# Patient Record
Sex: Male | Born: 1965 | Race: White | Hispanic: No | Marital: Single | Smoking: Current every day smoker
Health system: Southern US, Community
[De-identification: ages and names within clinical notes are randomized; demographics above are authoritative.]

## PROBLEM LIST (undated history)

## (undated) HISTORY — PX: HAND SURGERY: SHX662

## (undated) HISTORY — PX: MANDIBLE FRACTURE SURGERY: SHX706

---

## 2001-02-22 ENCOUNTER — Emergency Department (HOSPITAL_COMMUNITY): Admission: EM | Admit: 2001-02-22 | Discharge: 2001-02-22 | Payer: Self-pay | Admitting: Emergency Medicine

## 2001-04-16 ENCOUNTER — Emergency Department (HOSPITAL_COMMUNITY): Admission: EM | Admit: 2001-04-16 | Discharge: 2001-04-16 | Payer: Self-pay | Admitting: Emergency Medicine

## 2001-05-26 ENCOUNTER — Emergency Department (HOSPITAL_COMMUNITY): Admission: EM | Admit: 2001-05-26 | Discharge: 2001-05-26 | Payer: Self-pay | Admitting: Emergency Medicine

## 2001-05-28 ENCOUNTER — Emergency Department (HOSPITAL_COMMUNITY): Admission: EM | Admit: 2001-05-28 | Discharge: 2001-05-28 | Payer: Self-pay | Admitting: Emergency Medicine

## 2001-06-21 ENCOUNTER — Emergency Department (HOSPITAL_COMMUNITY): Admission: EM | Admit: 2001-06-21 | Discharge: 2001-06-21 | Payer: Self-pay | Admitting: *Deleted

## 2001-06-24 ENCOUNTER — Emergency Department (HOSPITAL_COMMUNITY): Admission: EM | Admit: 2001-06-24 | Discharge: 2001-06-24 | Payer: Self-pay

## 2001-07-27 ENCOUNTER — Emergency Department (HOSPITAL_COMMUNITY): Admission: EM | Admit: 2001-07-27 | Discharge: 2001-07-27 | Payer: Self-pay | Admitting: Emergency Medicine

## 2001-08-07 ENCOUNTER — Emergency Department (HOSPITAL_COMMUNITY): Admission: EM | Admit: 2001-08-07 | Discharge: 2001-08-07 | Payer: Self-pay | Admitting: *Deleted

## 2001-11-12 ENCOUNTER — Emergency Department (HOSPITAL_COMMUNITY): Admission: EM | Admit: 2001-11-12 | Discharge: 2001-11-12 | Payer: Self-pay | Admitting: Emergency Medicine

## 2001-12-25 ENCOUNTER — Emergency Department (HOSPITAL_COMMUNITY): Admission: EM | Admit: 2001-12-25 | Discharge: 2001-12-25 | Payer: Self-pay | Admitting: Emergency Medicine

## 2002-02-17 ENCOUNTER — Emergency Department (HOSPITAL_COMMUNITY): Admission: EM | Admit: 2002-02-17 | Discharge: 2002-02-17 | Payer: Self-pay | Admitting: Emergency Medicine

## 2002-06-22 ENCOUNTER — Emergency Department (HOSPITAL_COMMUNITY): Admission: EM | Admit: 2002-06-22 | Discharge: 2002-06-22 | Payer: Self-pay | Admitting: Emergency Medicine

## 2002-06-22 ENCOUNTER — Encounter: Payer: Self-pay | Admitting: Emergency Medicine

## 2002-07-06 ENCOUNTER — Emergency Department (HOSPITAL_COMMUNITY): Admission: EM | Admit: 2002-07-06 | Discharge: 2002-07-06 | Payer: Self-pay | Admitting: Emergency Medicine

## 2002-08-06 ENCOUNTER — Encounter: Payer: Self-pay | Admitting: Emergency Medicine

## 2002-08-06 ENCOUNTER — Emergency Department (HOSPITAL_COMMUNITY): Admission: EM | Admit: 2002-08-06 | Discharge: 2002-08-06 | Payer: Self-pay | Admitting: Emergency Medicine

## 2002-08-10 ENCOUNTER — Emergency Department (HOSPITAL_COMMUNITY): Admission: EM | Admit: 2002-08-10 | Discharge: 2002-08-10 | Payer: Self-pay | Admitting: Emergency Medicine

## 2002-08-26 ENCOUNTER — Encounter: Payer: Self-pay | Admitting: Emergency Medicine

## 2002-08-26 ENCOUNTER — Emergency Department (HOSPITAL_COMMUNITY): Admission: EM | Admit: 2002-08-26 | Discharge: 2002-08-26 | Payer: Self-pay | Admitting: Emergency Medicine

## 2002-08-28 ENCOUNTER — Emergency Department (HOSPITAL_COMMUNITY): Admission: EM | Admit: 2002-08-28 | Discharge: 2002-08-28 | Payer: Self-pay | Admitting: Emergency Medicine

## 2002-09-06 ENCOUNTER — Emergency Department (HOSPITAL_COMMUNITY): Admission: EM | Admit: 2002-09-06 | Discharge: 2002-09-07 | Payer: Self-pay | Admitting: Emergency Medicine

## 2002-09-29 ENCOUNTER — Emergency Department (HOSPITAL_COMMUNITY): Admission: EM | Admit: 2002-09-29 | Discharge: 2002-09-29 | Payer: Self-pay | Admitting: Emergency Medicine

## 2002-10-16 ENCOUNTER — Emergency Department (HOSPITAL_COMMUNITY): Admission: EM | Admit: 2002-10-16 | Discharge: 2002-10-16 | Payer: Self-pay | Admitting: Emergency Medicine

## 2002-10-16 ENCOUNTER — Encounter: Payer: Self-pay | Admitting: Emergency Medicine

## 2003-01-04 ENCOUNTER — Encounter: Payer: Self-pay | Admitting: Emergency Medicine

## 2003-01-04 ENCOUNTER — Emergency Department (HOSPITAL_COMMUNITY): Admission: EM | Admit: 2003-01-04 | Discharge: 2003-01-04 | Payer: Self-pay | Admitting: Emergency Medicine

## 2003-01-15 ENCOUNTER — Emergency Department (HOSPITAL_COMMUNITY): Admission: AD | Admit: 2003-01-15 | Discharge: 2003-01-15 | Payer: Self-pay | Admitting: Family Medicine

## 2003-03-16 ENCOUNTER — Emergency Department (HOSPITAL_COMMUNITY): Admission: EM | Admit: 2003-03-16 | Discharge: 2003-03-16 | Payer: Self-pay | Admitting: Emergency Medicine

## 2003-03-19 ENCOUNTER — Emergency Department (HOSPITAL_COMMUNITY): Admission: AD | Admit: 2003-03-19 | Discharge: 2003-03-19 | Payer: Self-pay | Admitting: Family Medicine

## 2003-04-20 ENCOUNTER — Emergency Department (HOSPITAL_COMMUNITY): Admission: EM | Admit: 2003-04-20 | Discharge: 2003-04-20 | Payer: Self-pay | Admitting: Emergency Medicine

## 2003-05-02 ENCOUNTER — Emergency Department (HOSPITAL_COMMUNITY): Admission: EM | Admit: 2003-05-02 | Discharge: 2003-05-02 | Payer: Self-pay | Admitting: Emergency Medicine

## 2003-06-01 ENCOUNTER — Emergency Department (HOSPITAL_COMMUNITY): Admission: AD | Admit: 2003-06-01 | Discharge: 2003-06-01 | Payer: Self-pay | Admitting: Family Medicine

## 2003-09-09 ENCOUNTER — Emergency Department (HOSPITAL_COMMUNITY): Admission: EM | Admit: 2003-09-09 | Discharge: 2003-09-10 | Payer: Self-pay | Admitting: Emergency Medicine

## 2003-09-14 ENCOUNTER — Emergency Department (HOSPITAL_COMMUNITY): Admission: EM | Admit: 2003-09-14 | Discharge: 2003-09-14 | Payer: Self-pay | Admitting: Emergency Medicine

## 2003-10-27 ENCOUNTER — Emergency Department (HOSPITAL_COMMUNITY): Admission: EM | Admit: 2003-10-27 | Discharge: 2003-10-27 | Payer: Self-pay

## 2003-10-29 ENCOUNTER — Emergency Department (HOSPITAL_COMMUNITY): Admission: EM | Admit: 2003-10-29 | Discharge: 2003-10-29 | Payer: Self-pay | Admitting: Emergency Medicine

## 2003-12-21 ENCOUNTER — Emergency Department (HOSPITAL_COMMUNITY): Admission: EM | Admit: 2003-12-21 | Discharge: 2003-12-21 | Payer: Self-pay | Admitting: Family Medicine

## 2003-12-28 ENCOUNTER — Emergency Department (HOSPITAL_COMMUNITY): Admission: EM | Admit: 2003-12-28 | Discharge: 2003-12-28 | Payer: Self-pay

## 2004-05-25 ENCOUNTER — Emergency Department (HOSPITAL_COMMUNITY): Admission: EM | Admit: 2004-05-25 | Discharge: 2004-05-25 | Payer: Self-pay | Admitting: Emergency Medicine

## 2004-09-22 ENCOUNTER — Emergency Department (HOSPITAL_COMMUNITY): Admission: EM | Admit: 2004-09-22 | Discharge: 2004-09-22 | Payer: Self-pay | Admitting: Emergency Medicine

## 2004-10-19 IMAGING — CR DG HAND COMPLETE 3+V*L*
2 series · 2 of 2 positions shown · non-contrast
Comparison: none

CLINICAL DATA: Injury. 
 LEFT HAND THREE VIEWS ([DATE] HOURS)

[view not recorded (1 of 2)]
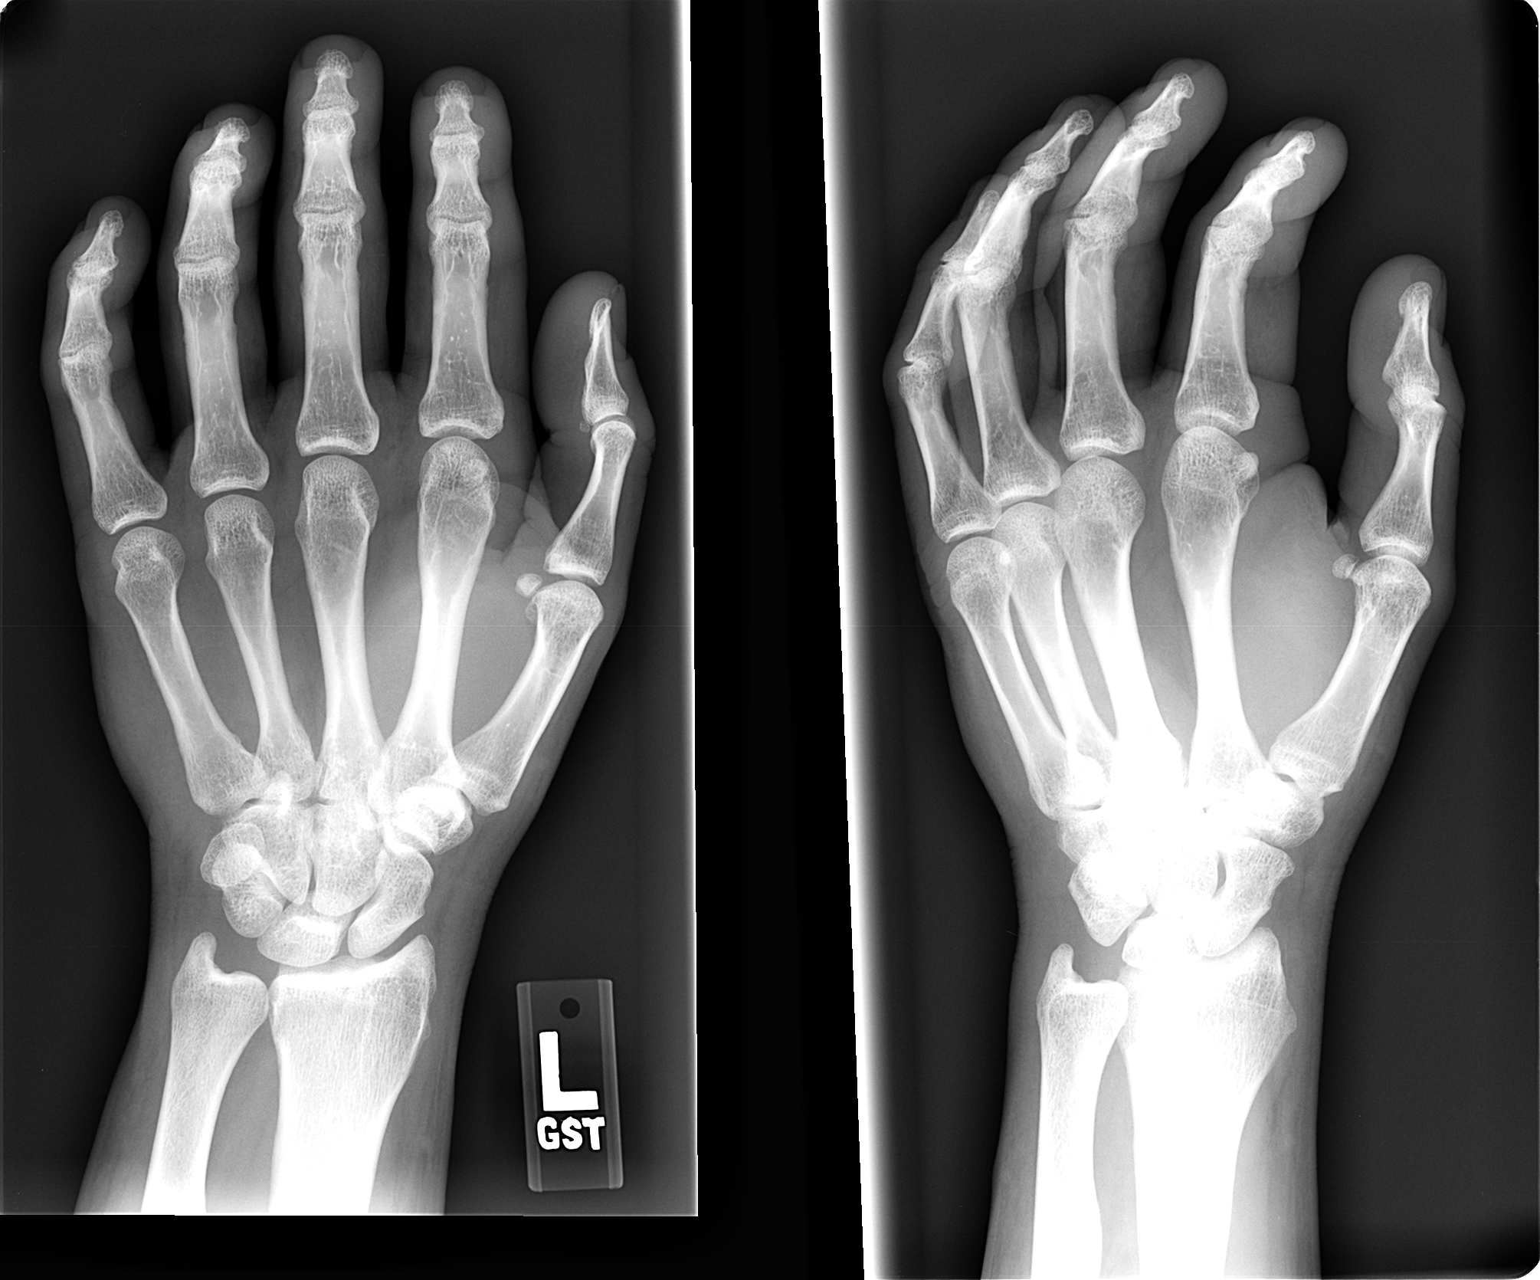

[view not recorded (2 of 2)]
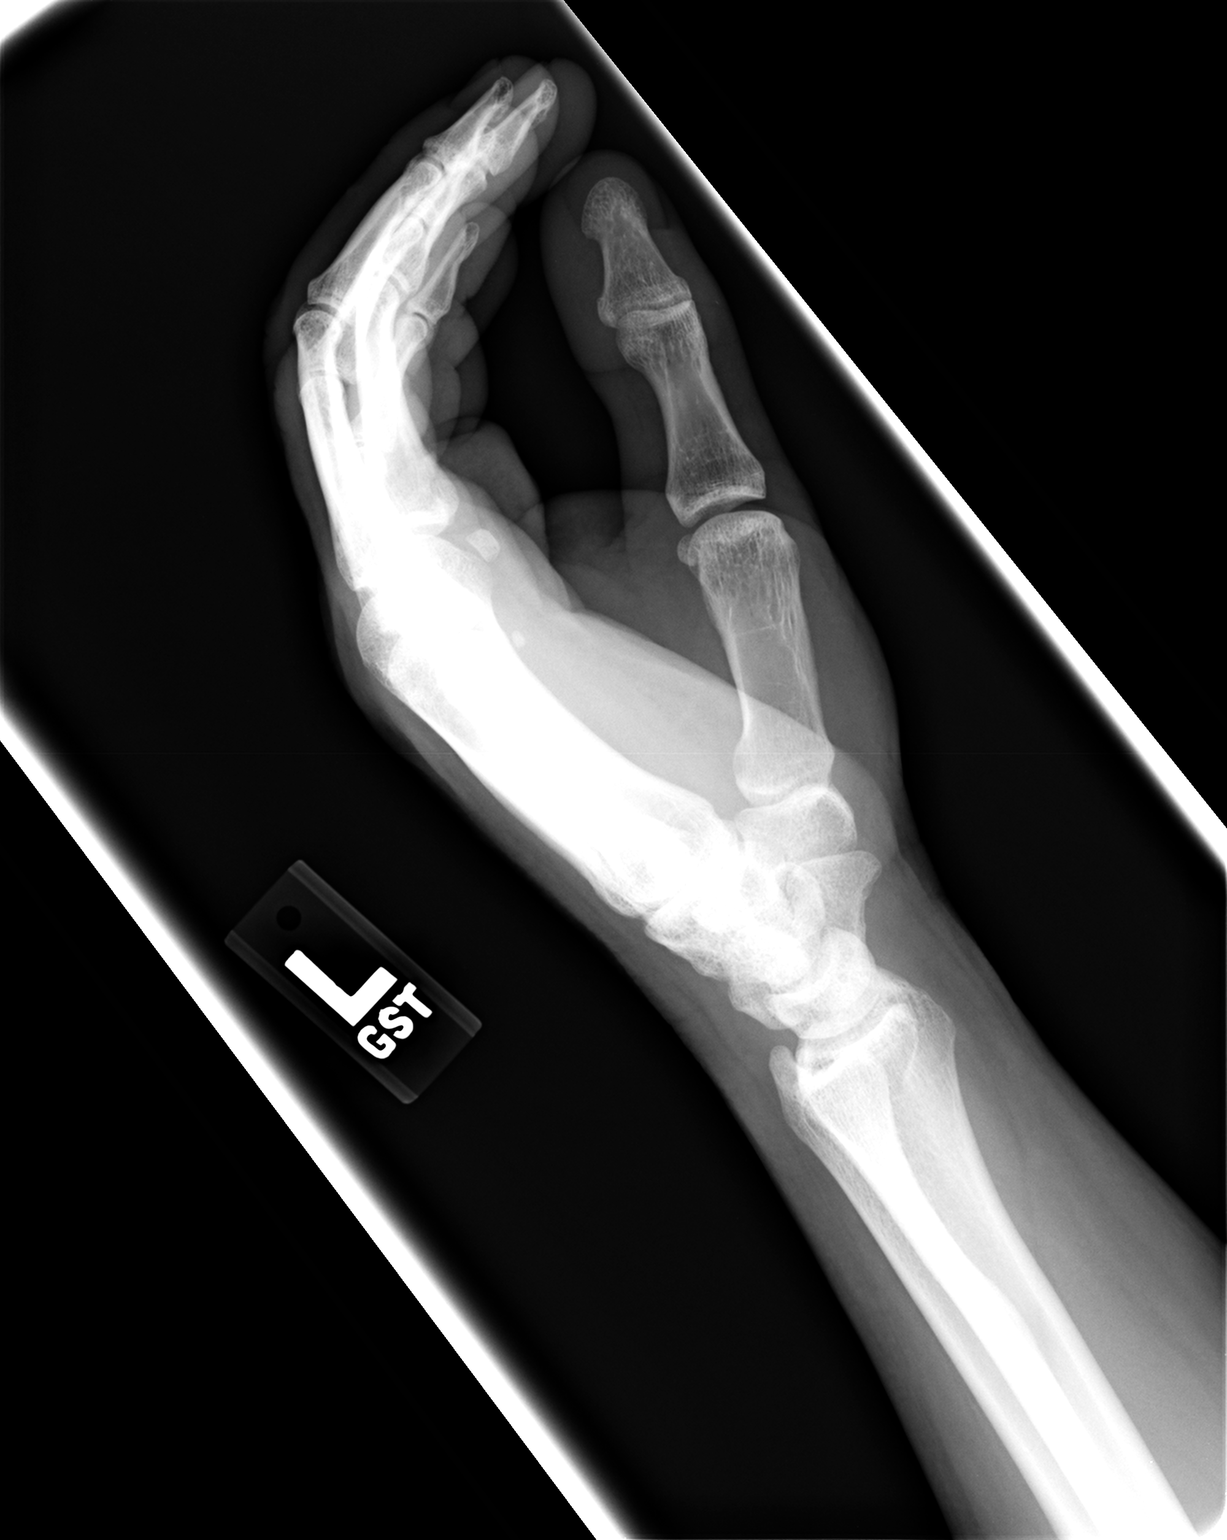

[2 of 2 positions shown; findings below may reference images not displayed]

FINDINGS: There is no evidence of fracture or dislocation.  No other significant bone or soft tissue abnormalities are identified.  The joint spaces are within normal limits.

 IMPRESSION
 Normal Study.

 LEFT WRIST FOUR VIEWS ([DATE] HOURS)
FINDINGS: There is no evidence of fracture or dislocation. No other significant bone or soft tissue abnormalities are identified. The joint spaces are within normal limits. 

 IMPRESSION
 Normal study.

## 2005-03-03 ENCOUNTER — Emergency Department (HOSPITAL_COMMUNITY): Admission: EM | Admit: 2005-03-03 | Discharge: 2005-03-03 | Payer: Self-pay | Admitting: *Deleted

## 2005-08-08 ENCOUNTER — Emergency Department (HOSPITAL_COMMUNITY): Admission: EM | Admit: 2005-08-08 | Discharge: 2005-08-09 | Payer: Self-pay | Admitting: Emergency Medicine

## 2006-10-15 ENCOUNTER — Emergency Department (HOSPITAL_COMMUNITY): Admission: EM | Admit: 2006-10-15 | Discharge: 2006-10-15 | Payer: Self-pay | Admitting: Emergency Medicine

## 2008-06-11 ENCOUNTER — Emergency Department (HOSPITAL_COMMUNITY): Admission: EM | Admit: 2008-06-11 | Discharge: 2008-06-11 | Payer: Self-pay | Admitting: Emergency Medicine

## 2008-09-04 ENCOUNTER — Emergency Department (HOSPITAL_COMMUNITY): Admission: EM | Admit: 2008-09-04 | Discharge: 2008-09-04 | Payer: Self-pay | Admitting: Emergency Medicine

## 2008-10-20 ENCOUNTER — Emergency Department (HOSPITAL_BASED_OUTPATIENT_CLINIC_OR_DEPARTMENT_OTHER): Admission: EM | Admit: 2008-10-20 | Discharge: 2008-10-20 | Payer: Self-pay | Admitting: Emergency Medicine

## 2008-10-20 ENCOUNTER — Ambulatory Visit: Payer: Self-pay | Admitting: Diagnostic Radiology

## 2008-12-24 ENCOUNTER — Emergency Department (HOSPITAL_COMMUNITY): Admission: EM | Admit: 2008-12-24 | Discharge: 2008-12-25 | Payer: Self-pay | Admitting: Emergency Medicine

## 2008-12-25 ENCOUNTER — Emergency Department (HOSPITAL_COMMUNITY): Admission: EM | Admit: 2008-12-25 | Discharge: 2008-12-25 | Payer: Self-pay | Admitting: Emergency Medicine

## 2009-07-28 ENCOUNTER — Emergency Department (HOSPITAL_COMMUNITY): Admission: EM | Admit: 2009-07-28 | Discharge: 2009-07-29 | Payer: Self-pay | Admitting: Emergency Medicine

## 2009-12-23 ENCOUNTER — Emergency Department (HOSPITAL_COMMUNITY): Admission: EM | Admit: 2009-12-23 | Discharge: 2009-12-23 | Payer: Self-pay | Admitting: Emergency Medicine

## 2010-02-01 ENCOUNTER — Emergency Department (HOSPITAL_COMMUNITY)
Admission: EM | Admit: 2010-02-01 | Discharge: 2010-02-01 | Payer: Self-pay | Source: Home / Self Care | Admitting: Emergency Medicine

## 2010-03-23 ENCOUNTER — Emergency Department (HOSPITAL_BASED_OUTPATIENT_CLINIC_OR_DEPARTMENT_OTHER)
Admission: EM | Admit: 2010-03-23 | Discharge: 2010-03-23 | Payer: Self-pay | Source: Home / Self Care | Admitting: Emergency Medicine

## 2010-05-24 LAB — COMPREHENSIVE METABOLIC PANEL
Alkaline Phosphatase: 72 U/L (ref 39–117)
BUN: 5 mg/dL — ABNORMAL LOW (ref 6–23)
Calcium: 9.1 mg/dL (ref 8.4–10.5)
Creatinine, Ser: 0.93 mg/dL (ref 0.4–1.5)
Glucose, Bld: 86 mg/dL (ref 70–99)
Total Protein: 7.1 g/dL (ref 6.0–8.3)

## 2010-05-24 LAB — CBC
HCT: 41.7 % (ref 39.0–52.0)
MCH: 32.7 pg (ref 26.0–34.0)
MCHC: 35.5 g/dL (ref 30.0–36.0)
MCV: 92.1 fL (ref 78.0–100.0)
RDW: 12.9 % (ref 11.5–15.5)

## 2010-05-24 LAB — URINALYSIS, ROUTINE W REFLEX MICROSCOPIC
Nitrite: NEGATIVE
Specific Gravity, Urine: 1.015 (ref 1.005–1.030)
Urobilinogen, UA: 0.2 mg/dL (ref 0.0–1.0)

## 2010-05-24 LAB — DIFFERENTIAL
Basophils Relative: 0 % (ref 0–1)
Monocytes Relative: 8 % (ref 3–12)
Neutro Abs: 7.8 10*3/uL — ABNORMAL HIGH (ref 1.7–7.7)
Neutrophils Relative %: 78 % — ABNORMAL HIGH (ref 43–77)

## 2010-05-24 LAB — URINE MICROSCOPIC-ADD ON

## 2010-05-24 LAB — RAPID URINE DRUG SCREEN, HOSP PERFORMED
Amphetamines: NOT DETECTED
Barbiturates: NOT DETECTED
Tetrahydrocannabinol: NOT DETECTED

## 2010-05-30 LAB — COMPREHENSIVE METABOLIC PANEL
ALT: 19 U/L (ref 0–53)
Alkaline Phosphatase: 71 U/L (ref 39–117)
CO2: 26 mEq/L (ref 19–32)
Chloride: 108 mEq/L (ref 96–112)
GFR calc non Af Amer: >60 mL/min (ref >60)
Glucose, Bld: 127 mg/dL — ABNORMAL HIGH (ref 70–99)
Potassium: 3.4 mEq/L — ABNORMAL LOW (ref 3.5–5.1)
Sodium: 141 mEq/L (ref 135–145)

## 2010-05-30 LAB — DIFFERENTIAL
Basophils Absolute: 0.1 10*3/uL (ref 0.0–0.1)
Basophils Relative: 1 % (ref 0–1)
Eosinophils Absolute: 0 10*3/uL (ref 0.0–0.7)
Monocytes Absolute: 0.5 10*3/uL (ref 0.1–1.0)
Neutro Abs: 9.5 10*3/uL — ABNORMAL HIGH (ref 1.7–7.7)
Neutrophils Relative %: 78 % — ABNORMAL HIGH (ref 43–77)

## 2010-05-30 LAB — URINALYSIS, ROUTINE W REFLEX MICROSCOPIC
Glucose, UA: NEGATIVE mg/dL
Hgb urine dipstick: NEGATIVE
Protein, ur: NEGATIVE mg/dL
pH: 6 (ref 5.0–8.0)

## 2010-05-30 LAB — CBC
Hemoglobin: 15.9 g/dL (ref 13.0–17.0)
RBC: 4.86 MIL/uL (ref 4.22–5.81)
WBC: 12.2 10*3/uL — ABNORMAL HIGH (ref 4.0–10.5)

## 2010-05-30 LAB — TYPE AND SCREEN: Antibody Screen: NEGATIVE

## 2010-06-16 LAB — DIFFERENTIAL
Basophils Relative: 1 % (ref 0–1)
Eosinophils Absolute: 0.1 10*3/uL (ref 0.0–0.7)
Lymphs Abs: 2 10*3/uL (ref 0.7–4.0)
Monocytes Absolute: 0.6 10*3/uL (ref 0.1–1.0)
Monocytes Relative: 6 % (ref 3–12)

## 2010-06-16 LAB — BASIC METABOLIC PANEL
CO2: 23 mEq/L (ref 19–32)
Chloride: 103 mEq/L (ref 96–112)
Creatinine, Ser: 0.95 mg/dL (ref 0.4–1.5)
GFR calc Af Amer: >60 mL/min (ref >60)
Potassium: 3.5 mEq/L (ref 3.5–5.1)
Sodium: 134 mEq/L — ABNORMAL LOW (ref 135–145)

## 2010-06-16 LAB — CBC
HCT: 45.2 % (ref 39.0–52.0)
Hemoglobin: 15.6 g/dL (ref 13.0–17.0)
MCHC: 34.5 g/dL (ref 30.0–36.0)
MCV: 94.5 fL (ref 78.0–100.0)
RBC: 4.79 MIL/uL (ref 4.22–5.81)

## 2010-06-22 LAB — DIFFERENTIAL
Eosinophils Absolute: 0.3 10*3/uL (ref 0.0–0.7)
Lymphocytes Relative: 21 % (ref 12–46)
Lymphs Abs: 1.3 10*3/uL (ref 0.7–4.0)
Monocytes Relative: 6 % (ref 3–12)
Neutrophils Relative %: 67 % (ref 43–77)

## 2010-06-22 LAB — COMPREHENSIVE METABOLIC PANEL
ALT: 19 U/L (ref 0–53)
AST: 19 U/L (ref 0–37)
Calcium: 8.9 mg/dL (ref 8.4–10.5)
Creatinine, Ser: 0.87 mg/dL (ref 0.4–1.5)
GFR calc Af Amer: >60 mL/min (ref >60)
GFR calc non Af Amer: >60 mL/min (ref >60)
Glucose, Bld: 103 mg/dL — ABNORMAL HIGH (ref 70–99)
Sodium: 137 mEq/L (ref 135–145)
Total Protein: 6.6 g/dL (ref 6.0–8.3)

## 2010-06-22 LAB — CBC
MCHC: 34.9 g/dL (ref 30.0–36.0)
MCV: 92.7 fL (ref 78.0–100.0)
RDW: 13.3 % (ref 11.5–15.5)

## 2010-09-09 ENCOUNTER — Emergency Department (HOSPITAL_COMMUNITY): Payer: Self-pay

## 2010-09-09 ENCOUNTER — Emergency Department (HOSPITAL_COMMUNITY)
Admission: EM | Admit: 2010-09-09 | Discharge: 2010-09-09 | Disposition: A | Discharge status: Home / Self Care | Payer: No Typology Code available for payment source | Attending: Emergency Medicine | Admitting: Emergency Medicine

## 2010-09-09 DIAGNOSIS — R1012 Left upper quadrant pain: Secondary | ICD-10-CM | POA: Insufficient documentation

## 2010-09-09 DIAGNOSIS — IMO0002 Reserved for concepts with insufficient information to code with codable children: Secondary | ICD-10-CM | POA: Insufficient documentation

## 2010-09-09 DIAGNOSIS — R071 Chest pain on breathing: Secondary | ICD-10-CM | POA: Insufficient documentation

## 2010-09-09 DIAGNOSIS — H9210 Otorrhea, unspecified ear: Secondary | ICD-10-CM | POA: Insufficient documentation

## 2010-09-09 LAB — POCT I-STAT, CHEM 8
BUN: 9 mg/dL (ref 6–23)
Creatinine, Ser: 1.1 mg/dL (ref 0.50–1.35)
Glucose, Bld: 181 mg/dL — ABNORMAL HIGH (ref 70–99)
Hemoglobin: 15.6 g/dL (ref 13.0–17.0)
Potassium: 3.9 mEq/L (ref 3.5–5.1)
Sodium: 139 mEq/L (ref 135–145)

## 2010-09-09 MED ORDER — IOHEXOL 300 MG/ML  SOLN
100.0000 mL | Freq: Once | INTRAMUSCULAR | Status: AC | PRN
  Administered 2010-09-09: 100 mL via INTRAVENOUS

## 2010-12-26 LAB — CBC
HCT: 49.1
Hemoglobin: 16.7
MCHC: 34.1
MCV: 92.3
RBC: 5.32
RDW: 13.6

## 2010-12-26 LAB — URINALYSIS, MICROSCOPIC ONLY
Bilirubin Urine: NEGATIVE
Glucose, UA: NEGATIVE
Ketones, ur: NEGATIVE
Nitrite: NEGATIVE
Specific Gravity, Urine: 1.013
pH: 6

## 2011-04-08 ENCOUNTER — Emergency Department (INDEPENDENT_AMBULATORY_CARE_PROVIDER_SITE_OTHER): Payer: Self-pay

## 2011-04-08 ENCOUNTER — Emergency Department (HOSPITAL_BASED_OUTPATIENT_CLINIC_OR_DEPARTMENT_OTHER)
Admission: EM | Admit: 2011-04-08 | Discharge: 2011-04-08 | Disposition: A | Discharge status: Home / Self Care | Payer: Self-pay | Attending: Emergency Medicine | Admitting: Emergency Medicine

## 2011-04-08 ENCOUNTER — Encounter (HOSPITAL_BASED_OUTPATIENT_CLINIC_OR_DEPARTMENT_OTHER): Payer: Self-pay | Admitting: *Deleted

## 2011-04-08 DIAGNOSIS — M25539 Pain in unspecified wrist: Secondary | ICD-10-CM

## 2011-04-08 DIAGNOSIS — T07XXXA Unspecified multiple injuries, initial encounter: Secondary | ICD-10-CM | POA: Insufficient documentation

## 2011-04-08 DIAGNOSIS — W19XXXA Unspecified fall, initial encounter: Secondary | ICD-10-CM

## 2011-04-08 DIAGNOSIS — M79609 Pain in unspecified limb: Secondary | ICD-10-CM

## 2011-04-08 DIAGNOSIS — W010XXA Fall on same level from slipping, tripping and stumbling without subsequent striking against object, initial encounter: Secondary | ICD-10-CM

## 2011-04-08 DIAGNOSIS — R079 Chest pain, unspecified: Secondary | ICD-10-CM

## 2011-04-08 DIAGNOSIS — M25519 Pain in unspecified shoulder: Secondary | ICD-10-CM | POA: Insufficient documentation

## 2011-04-08 DIAGNOSIS — M25529 Pain in unspecified elbow: Secondary | ICD-10-CM | POA: Insufficient documentation

## 2011-04-08 DIAGNOSIS — F17200 Nicotine dependence, unspecified, uncomplicated: Secondary | ICD-10-CM | POA: Insufficient documentation

## 2011-04-08 DIAGNOSIS — Y9329 Activity, other involving ice and snow: Secondary | ICD-10-CM | POA: Insufficient documentation

## 2011-04-08 MED ORDER — NAPROXEN 500 MG PO TABS: 500.0000 mg | ORAL_TABLET | Freq: Two times a day (BID) | ORAL | Status: AC

## 2011-04-08 MED ORDER — HYDROCODONE-ACETAMINOPHEN 5-325 MG PO TABS: 1.0000 | ORAL_TABLET | Freq: Four times a day (QID) | ORAL | Status: AC | PRN

## 2011-04-08 MED ORDER — HYDROCODONE-ACETAMINOPHEN 5-325 MG PO TABS
ORAL_TABLET | ORAL | Status: AC
  Administered 2011-04-08: 1 via ORAL
  Filled 2011-04-08: qty 1

## 2011-04-08 MED ORDER — HYDROCODONE-ACETAMINOPHEN 5-325 MG PO TABS: 1.0000 | ORAL_TABLET | Freq: Once | ORAL | Status: DC

## 2011-04-08 NOTE — ED Notes (Signed)
Patient states he slipped on the ice and now his left arm, shoulder and side are hurting

## 2011-04-08 NOTE — ED Provider Notes (Signed)
History     CSN: [id number]  Arrival date & time 04/08/11  0147   First MD Initiated Contact with Patient 04/08/11 0151      Chief Complaint  Patient presents with  . Fall    (Consider location/radiation/quality/duration/timing/severity/associated sxs/prior treatment) Patient is a 46 y.o. male presenting with fall. The history is provided by the patient.  Fall Incident onset: today. Incident: pt slipped on the ice. The point of impact was the left elbow, left shoulder and left wrist. The pain is present in the left elbow, left shoulder and left wrist (left ribs). The pain is moderate. He was ambulatory at the scene. Pertinent negatives include no visual change, no numbness, no headaches, no loss of consciousness and no tingling. The symptoms are aggravated by activity.    History reviewed. No pertinent past medical history.  History reviewed. No pertinent past surgical history.  No family history on file.  History  Substance Use Topics  . Smoking status: Current Everyday Smoker  . Smokeless tobacco: Not on file  . Alcohol Use: No      Review of Systems  Neurological: Negative for tingling, loss of consciousness, numbness and headaches.  All other systems reviewed and are negative.    Allergies  Tramadol and Keflex  Home Medications  No current outpatient prescriptions on file.  BP 154/93  Pulse 86  Temp(Src) 98.4 F (36.9 C) (Oral)  Resp 20  SpO2 100%  Physical Exam  Nursing note and vitals reviewed. Constitutional: He appears well-developed and well-nourished. No distress.  HENT:  Head: Normocephalic and atraumatic.  Right Ear: External ear normal.  Left Ear: External ear normal.  Eyes: Conjunctivae are normal. Right eye exhibits no discharge. Left eye exhibits no discharge. No scleral icterus.  Neck: Neck supple. No tracheal deviation present.  Cardiovascular: Normal rate, regular rhythm and intact distal pulses.   Pulmonary/Chest: Effort normal and  breath sounds normal. No stridor. No respiratory distress. He has no wheezes. He has no rales. He exhibits tenderness. He exhibits no laceration and no deformity.         Abrasion, erythema  Abdominal: Soft. Bowel sounds are normal. He exhibits no distension. There is no tenderness. There is no rebound and no guarding.  Musculoskeletal: He exhibits no edema and no tenderness.       Left shoulder: He exhibits tenderness. He exhibits no swelling and no deformity.       Left elbow: He exhibits normal range of motion and no swelling. tenderness found.       Left wrist: He exhibits tenderness. He exhibits no swelling and no deformity.  Neurological: He is alert. He has normal strength. No sensory deficit. Cranial nerve deficit:  no gross defecits noted. He exhibits normal muscle tone. He displays no seizure activity. Coordination normal.  Skin: Skin is warm and dry. No rash noted.  Psychiatric: He has a normal mood and affect.    ED Course  Procedures (including critical care time)  Labs Reviewed - No data to display Dg Ribs Unilateral W/chest Left  04/08/2011  *RADIOLOGY REPORT*  Clinical Data: Left chest pain and side pain after fall.  LEFT RIBS AND CHEST - 3+ VIEW  Comparison: 09/09/2010  Findings: Shallow inspiration.  Normal heart size and pulmonary vascularity.  Lungs appear clear expanded.  No focal airspace consolidation.  No blunting of costophrenic angles.  No pneumothorax.  Left ribs appear intact.  No displaced fractures or focal bone lesions demonstrated.  IMPRESSION: No evidence of active  pulmonary disease.  No displaced rib fractures identified on the left.  Original Report Authenticated By: Marlon Pel, M.D.   Dg Elbow Complete Left  04/08/2011  *RADIOLOGY REPORT*  Clinical Data: Left arm pain after fall  LEFT ELBOW - COMPLETE 3+ VIEW  Comparison: None.  Findings: The left elbow appears intact.  No evidence of acute fracture or subluxation.  No focal bone lesion or bone  destruction. Bone cortex and trabecular architecture appear intact.  No significant effusion.  Olecranon spur.  IMPRESSION: No acute bony abnormalities demonstrated.  Original Report Authenticated By: Marlon Pel, M.D.   Dg Wrist Complete Left  04/08/2011  *RADIOLOGY REPORT*  Clinical Data: Pain after fall  LEFT WRIST - COMPLETE 3+ VIEW  Comparison: 10/27/2003  Findings: The left wrist appears intact.  No evidence of acute fracture or subluxation.  No focal bone lesion or bone destruction. Bone cortex and trabecular architecture appear intact.  IMPRESSION: No acute bony abnormalities identified.  Original Report Authenticated By: Marlon Pel, M.D.   Dg Shoulder Left  04/08/2011  *RADIOLOGY REPORT*  Clinical Data: Left shoulder pain after fall on ice  LEFT SHOULDER - 2+ VIEW  Comparison: 12/23/2009  Findings: The left shoulder appears intact.  No evidence of acute fracture or subluxation.  No focal bone lesion or bone destruction. Bone cortex and trabecular architecture appear intact.  Downsloping acromion.  No significant change since previous study.  IMPRESSION: No acute bony abnormalities demonstrated.  Original Report Authenticated By: Marlon Pel, M.D.     1. Fall   2. Multiple contusions       MDM  Pt with contusion , sprain.  No sign of fracture.  Will dc home with pain medications.       Celene Kras, MD 04/08/11 [phone]

## 2014-02-19 ENCOUNTER — Emergency Department (HOSPITAL_BASED_OUTPATIENT_CLINIC_OR_DEPARTMENT_OTHER): Payer: Self-pay

## 2014-02-19 ENCOUNTER — Emergency Department (HOSPITAL_BASED_OUTPATIENT_CLINIC_OR_DEPARTMENT_OTHER)
Admission: EM | Admit: 2014-02-19 | Discharge: 2014-02-19 | Disposition: A | Discharge status: Home / Self Care | Payer: Self-pay | Attending: Emergency Medicine | Admitting: Emergency Medicine

## 2014-02-19 ENCOUNTER — Encounter (HOSPITAL_BASED_OUTPATIENT_CLINIC_OR_DEPARTMENT_OTHER): Payer: Self-pay | Admitting: *Deleted

## 2014-02-19 DIAGNOSIS — Y990 Civilian activity done for income or pay: Secondary | ICD-10-CM | POA: Insufficient documentation

## 2014-02-19 DIAGNOSIS — Y9269 Other specified industrial and construction area as the place of occurrence of the external cause: Secondary | ICD-10-CM | POA: Insufficient documentation

## 2014-02-19 DIAGNOSIS — S99912A Unspecified injury of left ankle, initial encounter: Secondary | ICD-10-CM | POA: Insufficient documentation

## 2014-02-19 DIAGNOSIS — Y9389 Activity, other specified: Secondary | ICD-10-CM | POA: Insufficient documentation

## 2014-02-19 DIAGNOSIS — W208XXA Other cause of strike by thrown, projected or falling object, initial encounter: Secondary | ICD-10-CM | POA: Insufficient documentation

## 2014-02-19 DIAGNOSIS — S99922A Unspecified injury of left foot, initial encounter: Secondary | ICD-10-CM | POA: Insufficient documentation

## 2014-02-19 DIAGNOSIS — Z720 Tobacco use: Secondary | ICD-10-CM | POA: Insufficient documentation

## 2014-02-19 MED ORDER — HYDROCODONE-ACETAMINOPHEN 5-325 MG PO TABS: 1.0000 | ORAL_TABLET | ORAL | Status: DC | PRN

## 2014-02-19 MED ORDER — HYDROCODONE-ACETAMINOPHEN 5-325 MG PO TABS
2.0000 | ORAL_TABLET | Freq: Once | ORAL | Status: AC
  Administered 2014-02-19: 2 via ORAL
  Filled 2014-02-19: qty 2

## 2014-02-19 NOTE — ED Notes (Signed)
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
## 2014-02-19 NOTE — Discharge Instructions (Signed)
Take motrin for pain.   Take vicodin as needed for severe pain.   Try to stay off your foot.   Follow up with your doctor.   Return to ER if you have severe pain, unable to walk.
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
## 2014-04-23 ENCOUNTER — Emergency Department (HOSPITAL_BASED_OUTPATIENT_CLINIC_OR_DEPARTMENT_OTHER)
Admission: EM | Admit: 2014-04-23 | Discharge: 2014-04-23 | Disposition: A | Discharge status: Home / Self Care | Payer: Self-pay | Attending: Emergency Medicine | Admitting: Emergency Medicine

## 2014-04-23 ENCOUNTER — Emergency Department (HOSPITAL_BASED_OUTPATIENT_CLINIC_OR_DEPARTMENT_OTHER): Payer: Self-pay

## 2014-04-23 ENCOUNTER — Encounter (HOSPITAL_BASED_OUTPATIENT_CLINIC_OR_DEPARTMENT_OTHER): Payer: Self-pay | Admitting: *Deleted

## 2014-04-23 DIAGNOSIS — Z720 Tobacco use: Secondary | ICD-10-CM | POA: Insufficient documentation

## 2014-04-23 DIAGNOSIS — S52591A Other fractures of lower end of right radius, initial encounter for closed fracture: Secondary | ICD-10-CM | POA: Insufficient documentation

## 2014-04-23 DIAGNOSIS — Y9289 Other specified places as the place of occurrence of the external cause: Secondary | ICD-10-CM | POA: Insufficient documentation

## 2014-04-23 DIAGNOSIS — Y998 Other external cause status: Secondary | ICD-10-CM | POA: Insufficient documentation

## 2014-04-23 DIAGNOSIS — S52501A Unspecified fracture of the lower end of right radius, initial encounter for closed fracture: Secondary | ICD-10-CM

## 2014-04-23 DIAGNOSIS — W11XXXA Fall on and from ladder, initial encounter: Secondary | ICD-10-CM | POA: Insufficient documentation

## 2014-04-23 DIAGNOSIS — Y9389 Activity, other specified: Secondary | ICD-10-CM | POA: Insufficient documentation

## 2014-04-23 MED ORDER — HYDROCODONE-ACETAMINOPHEN 5-325 MG PO TABS: 1.0000 | ORAL_TABLET | ORAL | Status: AC | PRN

## 2014-04-23 NOTE — ED Notes (Signed)
Larey SeatFell 6' off a ladder landing onto a wooden deck. Swelling and pain to his right wrist.

## 2014-04-23 NOTE — ED Provider Notes (Signed)
CSN: [id number]     Arrival date & time 04/23/14  1326 History   First MD Initiated Contact with Patient 04/23/14 1339     Chief Complaint  Patient presents with  . Wrist Pain     (Consider location/radiation/quality/duration/timing/severity/associated sxs/prior Treatment) HPI Comments: 49 y/o male presenting with R wrist pain x 1 day. Pt reports yesterday evening he slipped and fell about 6 feet off a ladder landing onto a wooden deck with his right arm outstretched behind him. He went to work today and noticed he was unable to lift anything with his right hand due to pain. States there is swelling. Tried ibuprofen with no relief. Pain currently 8/10. No numbness or tingling.  Patient is a 49 y.o. male presenting with wrist pain. The history is provided by the patient.  Wrist Pain Pertinent negatives include no numbness.    History reviewed. No pertinent past medical history. History reviewed. No pertinent past surgical history. No family history on file. History  Substance Use Topics  . Smoking status: Current Every Day Smoker  . Smokeless tobacco: Not on file  . Alcohol Use: No    Review of Systems  Constitutional: Negative.   Musculoskeletal:       + R wrist pain and swelling.  Neurological: Negative for numbness.      Allergies  Tramadol and Cephalexin  Home Medications   Prior to Admission medications   Medication Sig Start Date End Date Taking? Authorizing Provider  HYDROcodone-acetaminophen (NORCO/VICODIN) 5-325 MG per tablet Take 1-2 tablets by mouth every 4 (four) hours as needed. 04/23/14   Cairo Agostinelli M Raena Pau, PA-C   BP 158/91 mmHg  Pulse 78  Temp(Src) 98 F (36.7 C) (Oral)  Resp 16  Ht  (1.753 m)  Wt 185 lb (83.915 kg)  BMI 27.31 kg/m2  SpO2 100% Physical Exam  Constitutional: He is oriented to person, place, and time. He appears well-developed and well-nourished. No distress.  HENT:  Head: Normocephalic and atraumatic.  Eyes: Conjunctivae and EOM  are normal.  Neck: Normal range of motion. Neck supple.  Cardiovascular: Normal rate, regular rhythm and normal heart sounds.   Pulmonary/Chest: Effort normal and breath sounds normal.  Musculoskeletal:  R wrist TTP over distal radius and ulna with swelling. ROM limited by pain. No deformity. Hand normal. R elbow normal. +2 radial pulse. Cap refill < 3 seconds.  Neurological: He is alert and oriented to person, place, and time.  Skin: Skin is warm and dry.  Psychiatric: He has a normal mood and affect. His behavior is normal.  Nursing note and vitals reviewed.   ED Course  Procedures (including critical care time) Labs Review Labs Reviewed - No data to display  Imaging Review Dg Wrist Complete Right  04/23/2014   CLINICAL DATA:  Fall from ladder onto wooden deck with pain and swelling of the right wrist. Initial encounter.  EXAM: RIGHT WRIST - COMPLETE 3+ VIEW  COMPARISON:  04/09/2014  FINDINGS: Recent fracture of the distal radius, with articular extension at the level of the lunate. No interval offset. No new fracture. No dislocation.  IMPRESSION: 1. No interval displacement of a recent intra-articular distal radius fracture. 2. No new osseous abnormality.   Electronically Signed   By: Marnee Spring M.D.   On: 04/23/2014 14:22     EKG Interpretation None      MDM   Final diagnoses:  Distal radius fracture, right, closed, initial encounter   Neurovascularly intact. Xray showing no interval displacement  of a recent intra-articular distal radius fracture, no new abnormality. On review of pt's chart, he had a fracture in Jan 2013. Given the mechanism of injury and swelling on exam, will treat as acute fracture and place pt in splint, have him f/u with ortho. Stable for d/c. Return precautions given. Patient states understanding of treatment care plan and is agreeable.  Kathrynn SpeedRobyn M Joani Cosma, PA-C 04/23/14 1501  Purvis SheffieldForrest Harrison, MD 04/23/14 1536

## 2014-04-28 ENCOUNTER — Encounter: Payer: Self-pay | Admitting: Family Medicine

## 2014-04-28 ENCOUNTER — Ambulatory Visit (INDEPENDENT_AMBULATORY_CARE_PROVIDER_SITE_OTHER): Payer: Self-pay | Admitting: Family Medicine

## 2014-04-28 VITALS — BP 162/113 | HR 90 | Ht 69.0 in | Wt 185.0 lb

## 2014-04-28 DIAGNOSIS — S6991XA Unspecified injury of right wrist, hand and finger(s), initial encounter: Secondary | ICD-10-CM

## 2014-04-29 ENCOUNTER — Encounter: Payer: Self-pay | Admitting: Family Medicine

## 2014-04-29 DIAGNOSIS — S6991XA Unspecified injury of right wrist, hand and finger(s), initial encounter: Secondary | ICD-10-CM | POA: Insufficient documentation
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
## 2014-04-29 NOTE — Assessment & Plan Note (Signed)
consistent with a distal radius fracture with intraarticular extension.  Discussed these typically take 6 weeks to heal.  Short arm cast placed today and work note provided.  F/u in 2 weeks to remove cast, repeat x-rays.

## 2014-05-12 ENCOUNTER — Ambulatory Visit: Payer: Self-pay | Admitting: Family Medicine
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
